# Patient Record
Sex: Male | Born: 1975 | Race: Black or African American | Hispanic: No | Marital: Married | Smoking: Never smoker
Health system: Southern US, Community
[De-identification: ages and names within clinical notes are randomized; demographics above are authoritative.]

## PROBLEM LIST (undated history)

## (undated) HISTORY — PX: DENTAL SURGERY: SHX609

---

## 2003-10-30 ENCOUNTER — Emergency Department (HOSPITAL_COMMUNITY): Admission: EM | Admit: 2003-10-30 | Discharge: 2003-10-30 | Payer: Self-pay | Admitting: Emergency Medicine

## 2005-05-11 ENCOUNTER — Inpatient Hospital Stay (HOSPITAL_COMMUNITY): Admission: EM | Admit: 2005-05-11 | Discharge: 2005-05-12 | Payer: Self-pay | Admitting: Psychiatry

## 2005-05-11 ENCOUNTER — Ambulatory Visit: Payer: Self-pay | Admitting: Psychiatry

## 2006-06-03 IMAGING — CR DG CHEST 2V
2 series · 2 of 2 positions shown · non-contrast
Comparison: none

CLINICAL DATA: 27-year-old male with chest pain and shortness of breath.  
 CHEST, TWO VIEWS:

[view not recorded (1 of 2)]
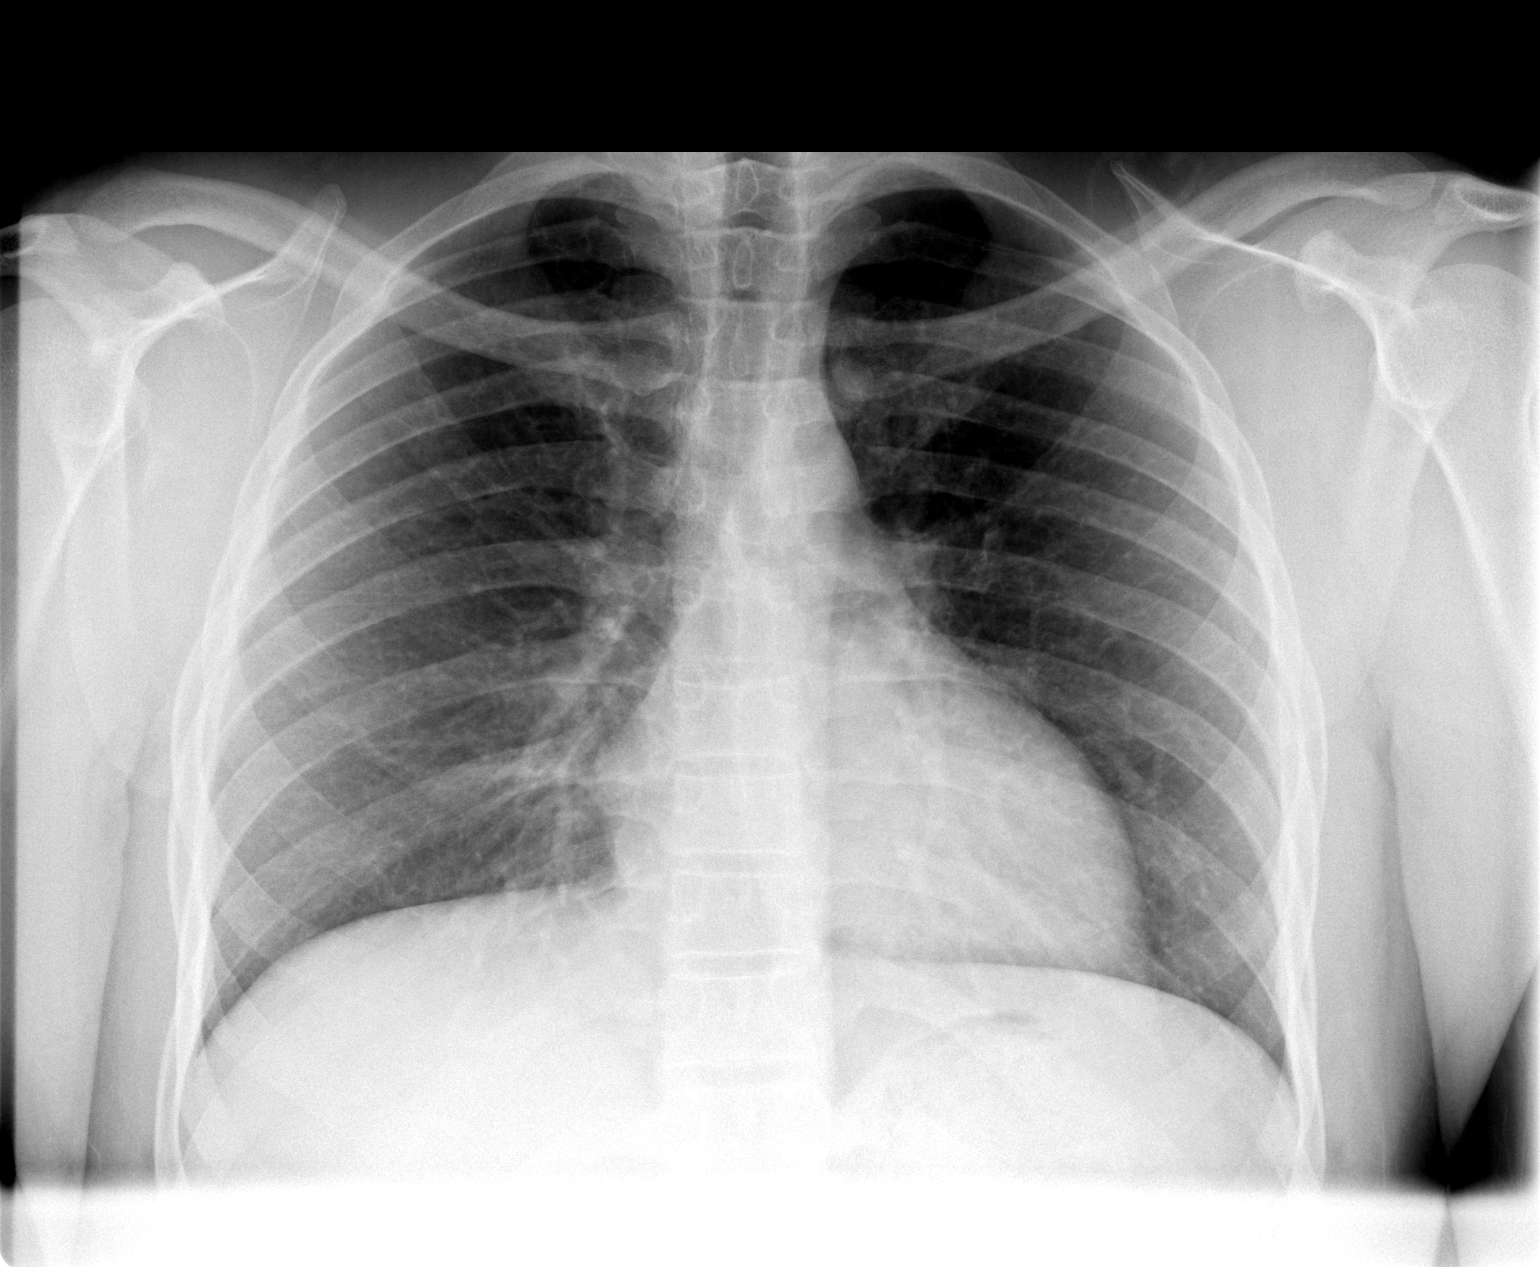

[view not recorded (2 of 2)]
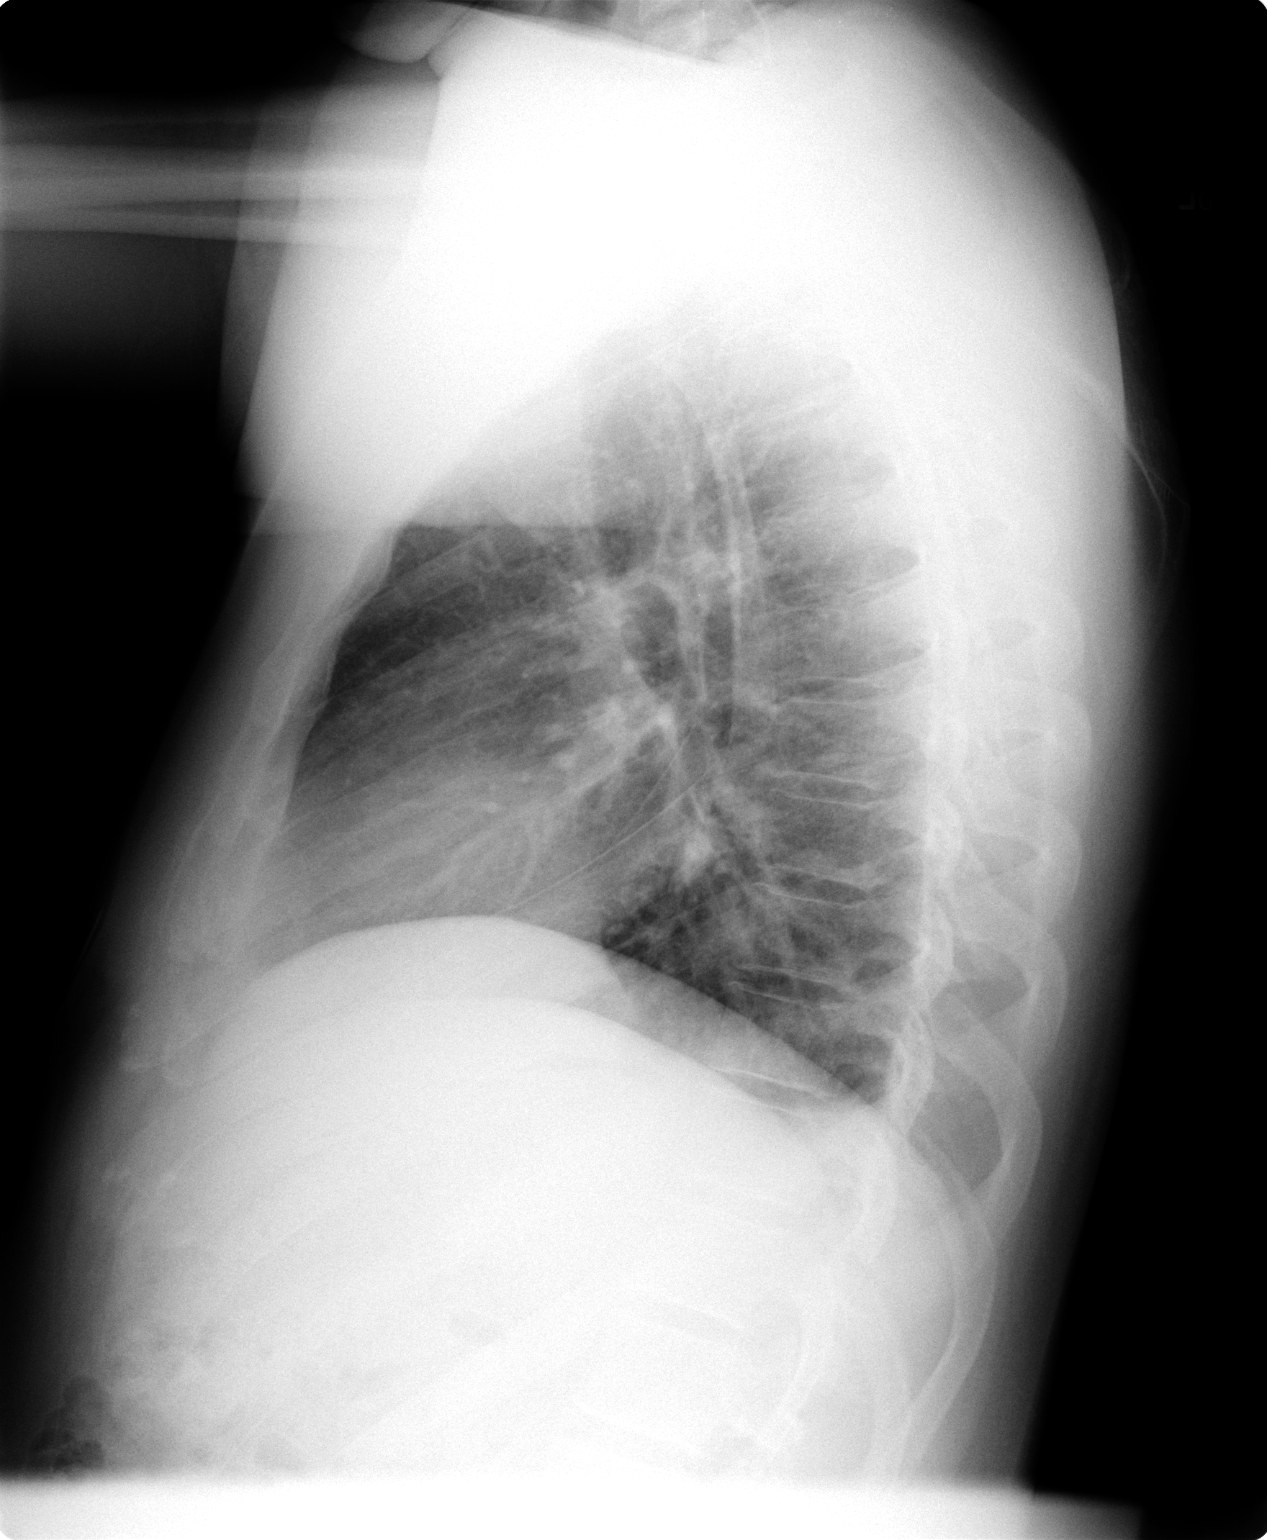

[2 of 2 positions shown; findings below may reference images not displayed]

FINDINGS: The heart size and mediastinal contours are normal.  The lungs are clear.  The visualized skeleton is unremarkable.
IMPRESSION: No active disease.

## 2022-03-22 ENCOUNTER — Emergency Department (HOSPITAL_COMMUNITY)
Admission: EM | Admit: 2022-03-22 | Discharge: 2022-03-22 | Disposition: A | Discharge status: Home / Self Care | Payer: No Typology Code available for payment source | Attending: Emergency Medicine | Admitting: Emergency Medicine

## 2022-03-22 ENCOUNTER — Emergency Department (HOSPITAL_COMMUNITY): Payer: Managed Care, Other (non HMO)

## 2022-03-22 ENCOUNTER — Other Ambulatory Visit: Payer: Self-pay

## 2022-03-22 ENCOUNTER — Encounter (HOSPITAL_COMMUNITY): Payer: Self-pay

## 2022-03-22 DIAGNOSIS — W228XXA Striking against or struck by other objects, initial encounter: Secondary | ICD-10-CM | POA: Insufficient documentation

## 2022-03-22 DIAGNOSIS — Y92410 Unspecified street and highway as the place of occurrence of the external cause: Secondary | ICD-10-CM | POA: Diagnosis not present

## 2022-03-22 DIAGNOSIS — M545 Low back pain, unspecified: Secondary | ICD-10-CM | POA: Diagnosis present

## 2022-03-22 MED ORDER — CYCLOBENZAPRINE HCL 10 MG PO TABS
5.0000 mg | ORAL_TABLET | Freq: Once | ORAL | Status: AC
  Administered 2022-03-22: 5 mg via ORAL
  Filled 2022-03-22: qty 1

## 2022-03-22 MED ORDER — CYCLOBENZAPRINE HCL 10 MG PO TABS: 10.0000 mg | ORAL_TABLET | Freq: Two times a day (BID) | ORAL | 0 refills | Status: AC | PRN
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
## 2022-04-15 MED ORDER — KETOROLAC TROMETHAMINE 30 MG/ML IJ SOLN
INTRAMUSCULAR | Status: AC
  Filled 2022-04-15: qty 1
